# Patient Record
Sex: Female | Born: 1985 | Hispanic: Yes | Marital: Single | Smoking: Never smoker
Health system: Southern US, Community
[De-identification: ages and names within clinical notes are randomized; demographics above are authoritative.]

## PROBLEM LIST (undated history)

## (undated) DIAGNOSIS — E119 Type 2 diabetes mellitus without complications: Secondary | ICD-10-CM

## (undated) DIAGNOSIS — E079 Disorder of thyroid, unspecified: Secondary | ICD-10-CM

## (undated) HISTORY — PX: CHOLECYSTECTOMY: SHX55

---

## 2011-10-23 ENCOUNTER — Observation Stay: Payer: Self-pay | Admitting: Surgery

## 2011-10-23 LAB — COMPREHENSIVE METABOLIC PANEL
Anion Gap: 14 (ref 7–16)
BUN: 7 mg/dL (ref 7–18)
Bilirubin,Total: 0.5 mg/dL (ref 0.2–1.0)
Calcium, Total: 9.4 mg/dL (ref 8.5–10.1)
Chloride: 100 mmol/L (ref 98–107)
Co2: 27 mmol/L (ref 21–32)
Creatinine: 0.62 mg/dL (ref 0.60–1.30)
EGFR (African American): >60
EGFR (Non-African Amer.): >60
Osmolality: 281 (ref 275–301)
Potassium: 3.6 mmol/L (ref 3.5–5.1)
SGOT(AST): 27 U/L (ref 15–37)
SGPT (ALT): 25 U/L
Total Protein: 9.2 g/dL — ABNORMAL HIGH (ref 6.4–8.2)

## 2011-10-23 LAB — CBC
MCH: 28.6 pg (ref 26.0–34.0)
MCHC: 33.8 g/dL (ref 32.0–36.0)
Platelet: 398 10*3/uL (ref 150–440)
RBC: 5.32 10*6/uL — ABNORMAL HIGH (ref 3.80–5.20)
WBC: 8.5 10*3/uL (ref 3.6–11.0)

## 2011-10-23 LAB — URINALYSIS, COMPLETE
Bacteria: NONE SEEN
Bilirubin,UR: NEGATIVE
Ketone: NEGATIVE
Ph: 8 (ref 4.5–8.0)
RBC,UR: 1834 /HPF (ref 0–5)
Specific Gravity: 1.02 (ref 1.003–1.030)
Squamous Epithelial: 8
WBC UR: 109 /HPF (ref 0–5)

## 2011-10-23 LAB — LIPASE, BLOOD: Lipase: 113 U/L (ref 73–393)

## 2011-10-23 LAB — PREGNANCY, URINE: Pregnancy Test, Urine: NEGATIVE m[IU]/mL

## 2011-10-25 LAB — CBC WITH DIFFERENTIAL/PLATELET
Basophil #: 0 10*3/uL (ref 0.0–0.1)
Basophil %: 0.4 %
Eosinophil #: 0.2 10*3/uL (ref 0.0–0.7)
Eosinophil %: 3.9 %
HCT: 38.8 % (ref 35.0–47.0)
HGB: 12.8 g/dL (ref 12.0–16.0)
Lymphocyte #: 2.5 10*3/uL (ref 1.0–3.6)
Lymphocyte %: 39.3 %
MCH: 28.3 pg (ref 26.0–34.0)
MCHC: 33 g/dL (ref 32.0–36.0)
MCV: 86 fL (ref 80–100)
Monocyte #: 0.5 10*3/uL (ref 0.0–0.7)
Monocyte %: 7.7 %
Neutrophil #: 3.1 10*3/uL (ref 1.4–6.5)
Neutrophil %: 78.7 %
Platelet: 274 10*3/uL (ref 150–440)
RBC: 4.52 10*6/uL (ref 3.80–5.20)
RDW: 13.8 % (ref 11.5–14.5)
WBC: 6.4 10*3/uL (ref 3.6–11.0)

## 2011-10-25 LAB — COMPREHENSIVE METABOLIC PANEL
Alkaline Phosphatase: 143 U/L — ABNORMAL HIGH (ref 50–136)
Anion Gap: 10 (ref 7–16)
Bilirubin,Total: 0.5 mg/dL (ref 0.2–1.0)
Calcium, Total: 8 mg/dL — ABNORMAL LOW (ref 8.5–10.1)
Chloride: 102 mmol/L (ref 98–107)
Co2: 27 mmol/L (ref 21–32)
EGFR (African American): >60
EGFR (Non-African Amer.): >60
Osmolality: 274 (ref 275–301)
Potassium: 4.2 mmol/L (ref 3.5–5.1)
SGPT (ALT): 152 U/L — ABNORMAL HIGH
Sodium: 139 mmol/L (ref 136–145)
Total Protein: 6.9 g/dL (ref 6.4–8.2)

## 2011-10-26 LAB — PATHOLOGY REPORT

## 2011-10-30 ENCOUNTER — Inpatient Hospital Stay: Payer: Self-pay | Admitting: Surgery

## 2011-10-30 LAB — PREGNANCY, URINE: Pregnancy Test, Urine: NEGATIVE m[IU]/mL

## 2011-10-30 LAB — URINALYSIS, COMPLETE
Ketone: NEGATIVE
Nitrite: NEGATIVE
Ph: 6 (ref 4.5–8.0)
Protein: NEGATIVE
RBC,UR: <1 /HPF (ref 0–5)
WBC UR: 3 /HPF (ref 0–5)

## 2011-10-30 LAB — COMPREHENSIVE METABOLIC PANEL
Albumin: 3.7 g/dL (ref 3.4–5.0)
Alkaline Phosphatase: 116 U/L (ref 50–136)
BUN: 9 mg/dL (ref 7–18)
Calcium, Total: 8.7 mg/dL (ref 8.5–10.1)
Glucose: 114 mg/dL — ABNORMAL HIGH (ref 65–99)
Osmolality: 275 (ref 275–301)
SGOT(AST): 42 U/L — ABNORMAL HIGH (ref 15–37)
Sodium: 138 mmol/L (ref 136–145)

## 2011-10-30 LAB — CBC
MCH: 28.7 pg (ref 26.0–34.0)
MCHC: 33.8 g/dL (ref 32.0–36.0)
MCV: 85 fL (ref 80–100)
Platelet: 345 10*3/uL (ref 150–440)
RBC: 5.16 10*6/uL (ref 3.80–5.20)
RDW: 14 % (ref 11.5–14.5)

## 2011-10-30 LAB — HCG, QUANTITATIVE, PREGNANCY: Beta Hcg, Quant.: <1 m[IU]/mL — ABNORMAL LOW

## 2011-10-31 LAB — CBC WITH DIFFERENTIAL/PLATELET
Basophil #: 0 10*3/uL (ref 0.0–0.1)
Basophil %: 0 %
Eosinophil #: 0.1 10*3/uL (ref 0.0–0.7)
HCT: 38.1 % (ref 35.0–47.0)
HGB: 12.6 g/dL (ref 12.0–16.0)
MCH: 28.3 pg (ref 26.0–34.0)
MCHC: 33.1 g/dL (ref 32.0–36.0)
MCV: 85 fL (ref 80–100)
Monocyte #: 0.7 10*3/uL (ref 0.0–0.7)
Neutrophil #: 7.7 10*3/uL — ABNORMAL HIGH (ref 1.4–6.5)
Neutrophil %: 77.4 %
RDW: 13.9 % (ref 11.5–14.5)

## 2011-10-31 LAB — COMPREHENSIVE METABOLIC PANEL
Alkaline Phosphatase: 107 U/L (ref 50–136)
BUN: 6 mg/dL — ABNORMAL LOW (ref 7–18)
Calcium, Total: 8.3 mg/dL — ABNORMAL LOW (ref 8.5–10.1)
Chloride: 102 mmol/L (ref 98–107)
Creatinine: 0.62 mg/dL (ref 0.60–1.30)
EGFR (Non-African Amer.): >60
Glucose: 115 mg/dL — ABNORMAL HIGH (ref 65–99)
Potassium: 3.8 mmol/L (ref 3.5–5.1)
SGPT (ALT): 64 U/L
Sodium: 136 mmol/L (ref 136–145)

## 2011-11-01 LAB — CBC WITH DIFFERENTIAL/PLATELET
Basophil #: 0 10*3/uL (ref 0.0–0.1)
Basophil %: 0.3 %
Eosinophil %: 1.5 %
HCT: 33.2 % — ABNORMAL LOW (ref 35.0–47.0)
HGB: 11.2 g/dL — ABNORMAL LOW (ref 12.0–16.0)
Lymphocyte %: 27.2 %
MCH: 28.9 pg (ref 26.0–34.0)
MCHC: 33.8 g/dL (ref 32.0–36.0)
MCV: 86 fL (ref 80–100)
Monocyte #: 1 10*3/uL — ABNORMAL HIGH (ref 0.0–0.7)
Monocyte %: 11.5 %
Platelet: 259 10*3/uL (ref 150–440)
RBC: 3.88 10*6/uL (ref 3.80–5.20)
RDW: 13.9 % (ref 11.5–14.5)
WBC: 8.9 10*3/uL (ref 3.6–11.0)

## 2011-11-01 LAB — COMPREHENSIVE METABOLIC PANEL
Albumin: 2.5 g/dL — ABNORMAL LOW (ref 3.4–5.0)
Alkaline Phosphatase: 99 U/L (ref 50–136)
Bilirubin,Total: 0.4 mg/dL (ref 0.2–1.0)
Calcium, Total: 8.1 mg/dL — ABNORMAL LOW (ref 8.5–10.1)
Chloride: 103 mmol/L (ref 98–107)
Co2: 27 mmol/L (ref 21–32)
Creatinine: 0.6 mg/dL (ref 0.60–1.30)
EGFR (African American): >60
EGFR (Non-African Amer.): >60
Osmolality: 277 (ref 275–301)
SGOT(AST): 41 U/L — ABNORMAL HIGH (ref 15–37)
SGPT (ALT): 61 U/L
Sodium: 140 mmol/L (ref 136–145)

## 2011-11-02 LAB — COMPREHENSIVE METABOLIC PANEL
Albumin: 2.4 g/dL — ABNORMAL LOW (ref 3.4–5.0)
Anion Gap: 12 (ref 7–16)
BUN: 2 mg/dL — ABNORMAL LOW (ref 7–18)
Bilirubin,Total: 0.3 mg/dL (ref 0.2–1.0)
Creatinine: 0.51 mg/dL — ABNORMAL LOW (ref 0.60–1.30)
Glucose: 109 mg/dL — ABNORMAL HIGH (ref 65–99)
Osmolality: 282 (ref 275–301)
SGOT(AST): 34 U/L (ref 15–37)
SGPT (ALT): 60 U/L

## 2011-11-02 LAB — CBC WITH DIFFERENTIAL/PLATELET
Eosinophil #: 0.2 10*3/uL (ref 0.0–0.7)
Eosinophil %: 2.5 %
HCT: 32.1 % — ABNORMAL LOW (ref 35.0–47.0)
Lymphocyte #: 2.8 10*3/uL (ref 1.0–3.6)
Lymphocyte %: 38.1 %
MCHC: 33.3 g/dL (ref 32.0–36.0)
MCV: 85 fL (ref 80–100)
Monocyte %: 8.9 %
Neutrophil #: 3.7 10*3/uL (ref 1.4–6.5)
Platelet: 272 10*3/uL (ref 150–440)
RBC: 3.76 10*6/uL — ABNORMAL LOW (ref 3.80–5.20)
WBC: 7.4 10*3/uL (ref 3.6–11.0)

## 2011-11-03 LAB — COMPREHENSIVE METABOLIC PANEL
Alkaline Phosphatase: 140 U/L — ABNORMAL HIGH (ref 50–136)
Calcium, Total: 8.2 mg/dL — ABNORMAL LOW (ref 8.5–10.1)
Co2: 28 mmol/L (ref 21–32)
EGFR (African American): >60
EGFR (Non-African Amer.): >60
Glucose: 113 mg/dL — ABNORMAL HIGH (ref 65–99)
Osmolality: 282 (ref 275–301)
SGOT(AST): 41 U/L — ABNORMAL HIGH (ref 15–37)
SGPT (ALT): 70 U/L

## 2011-11-03 LAB — CLOSTRIDIUM DIFFICILE BY PCR

## 2011-11-03 LAB — LIPASE, BLOOD: Lipase: 329 U/L (ref 73–393)

## 2011-11-03 LAB — OCCULT BLOOD X 1 CARD TO LAB, STOOL: Occult Blood, Feces: NEGATIVE

## 2011-11-04 LAB — WBCS, STOOL

## 2012-02-03 ENCOUNTER — Emergency Department: Payer: Self-pay | Admitting: Emergency Medicine

## 2012-02-03 LAB — URINALYSIS, COMPLETE
Bacteria: NONE SEEN
Bilirubin,UR: NEGATIVE
Glucose,UR: NEGATIVE mg/dL (ref 0–75)
Hyaline Cast: 1
Ketone: NEGATIVE
Protein: NEGATIVE
Specific Gravity: 1.017 (ref 1.003–1.030)
WBC UR: <1 /HPF (ref 0–5)

## 2012-02-03 LAB — CBC
HGB: 12.9 g/dL (ref 12.0–16.0)
MCH: 27.6 pg (ref 26.0–34.0)
MCHC: 33.1 g/dL (ref 32.0–36.0)
MCV: 83 fL (ref 80–100)
Platelet: 367 10*3/uL (ref 150–440)
RBC: 4.69 10*6/uL (ref 3.80–5.20)
WBC: 8.8 10*3/uL (ref 3.6–11.0)

## 2012-02-03 LAB — COMPREHENSIVE METABOLIC PANEL
Albumin: 3.3 g/dL — ABNORMAL LOW (ref 3.4–5.0)
Alkaline Phosphatase: 166 U/L — ABNORMAL HIGH (ref 50–136)
Anion Gap: 8 (ref 7–16)
BUN: 17 mg/dL (ref 7–18)
Bilirubin,Total: 0.3 mg/dL (ref 0.2–1.0)
Calcium, Total: 8.5 mg/dL (ref 8.5–10.1)
Chloride: 105 mmol/L (ref 98–107)
Co2: 27 mmol/L (ref 21–32)
EGFR (African American): >60
Glucose: 105 mg/dL — ABNORMAL HIGH (ref 65–99)
Osmolality: 281 (ref 275–301)
SGOT(AST): 68 U/L — ABNORMAL HIGH (ref 15–37)
SGPT (ALT): 40 U/L
Total Protein: 7.8 g/dL (ref 6.4–8.2)

## 2012-02-03 LAB — PREGNANCY, URINE: Pregnancy Test, Urine: NEGATIVE m[IU]/mL

## 2013-01-12 IMAGING — US ABDOMEN ULTRASOUND
1 series · 17 of 25 positions shown · non-contrast
Comparison: none

REASON FOR EXAM: postop LFT elevation
COMMENTS:

PROCEDURE:     US  - US ABDOMEN GENERAL SURVEY  - October 31, 2011  [DATE]
RESULT:

[Series 1: abdomen ultrasound · 17 of 93 slices shown]
[im 1/93]
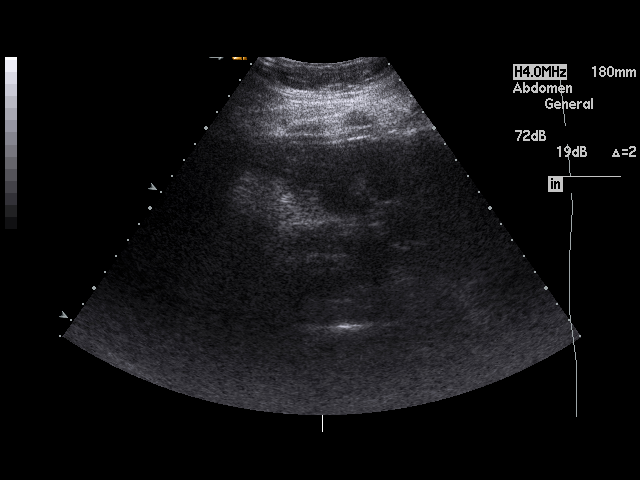
[im 8/93]
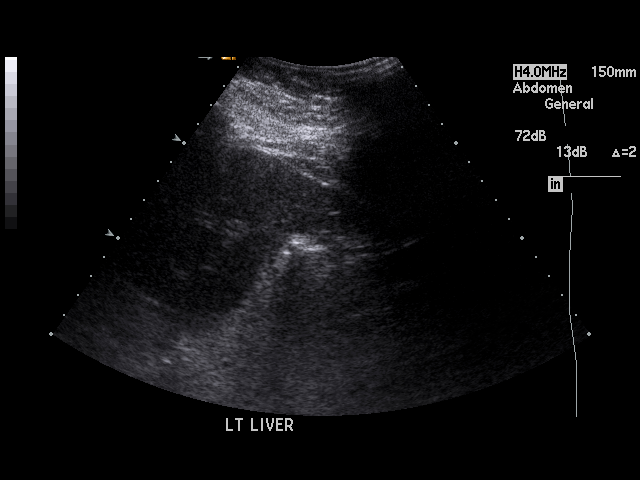
[im 12/93]
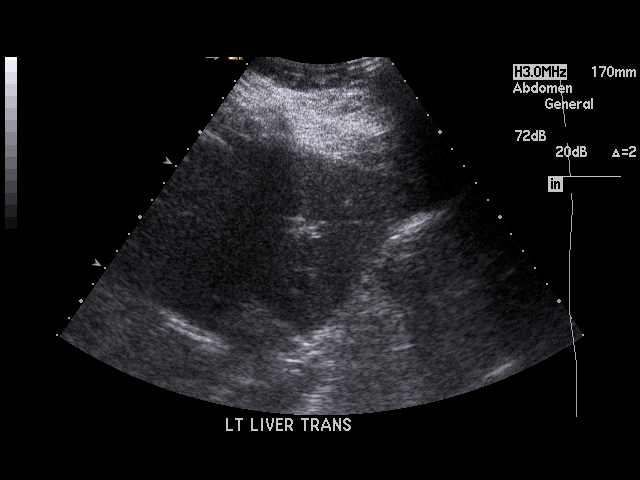
[im 20/93]
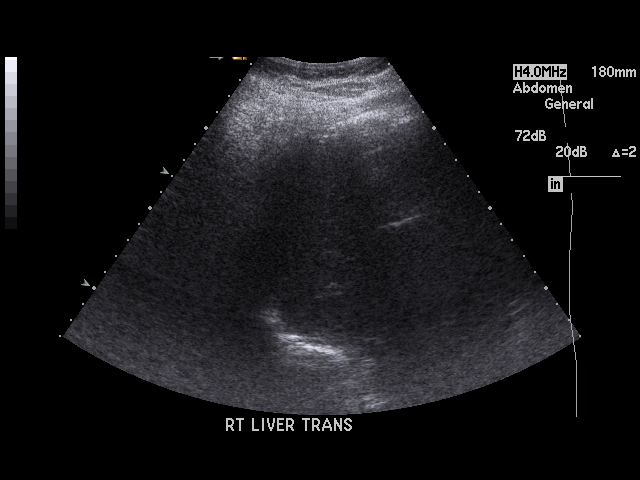
[im 24/93]
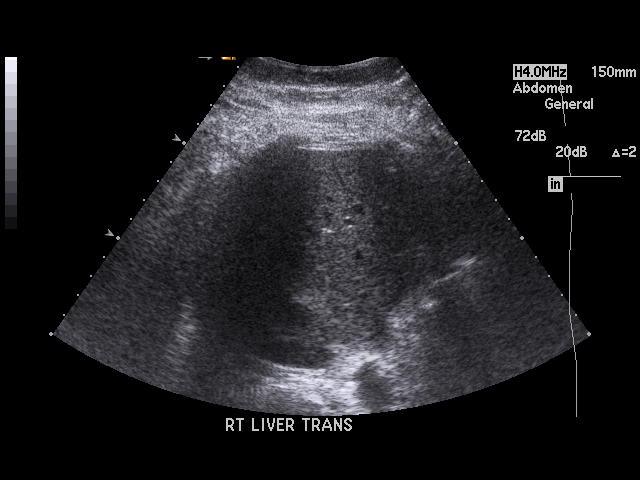
[im 31/93]
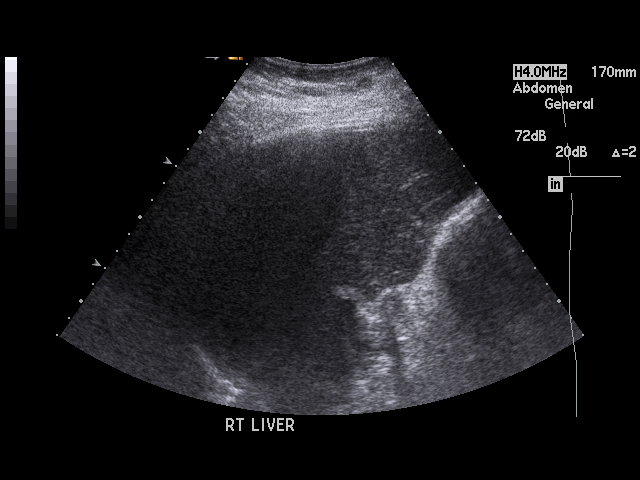
[im 35/93]
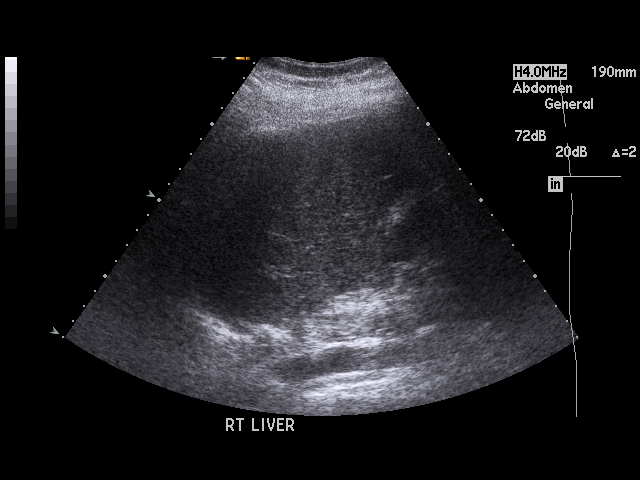
[im 43/93]
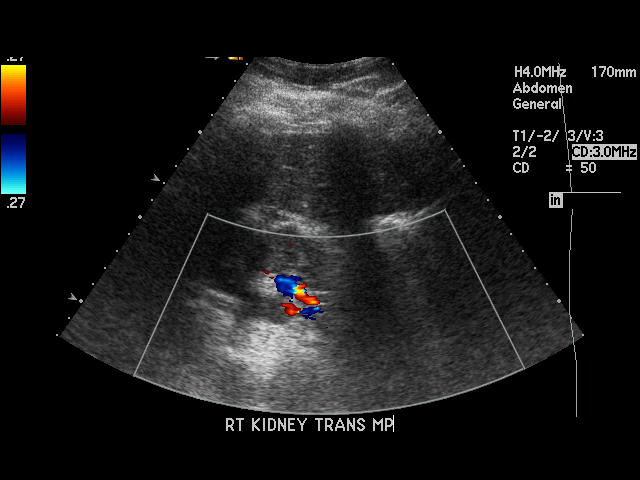
[im 47/93]
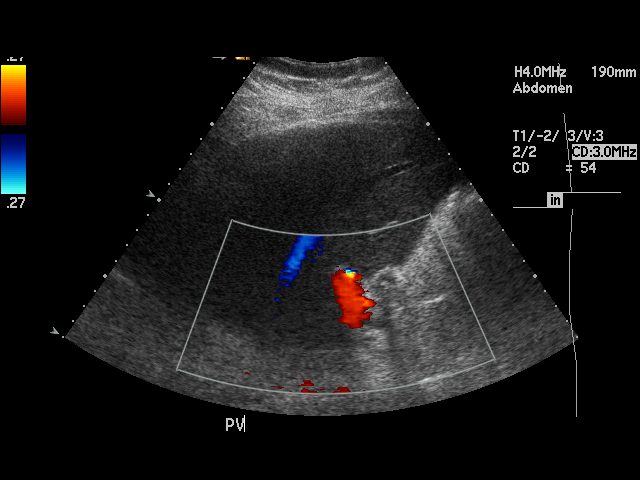
[im 50/93]
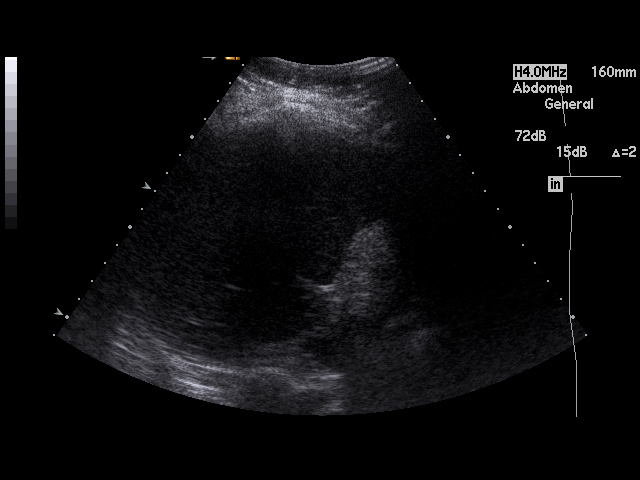
[im 58/93]
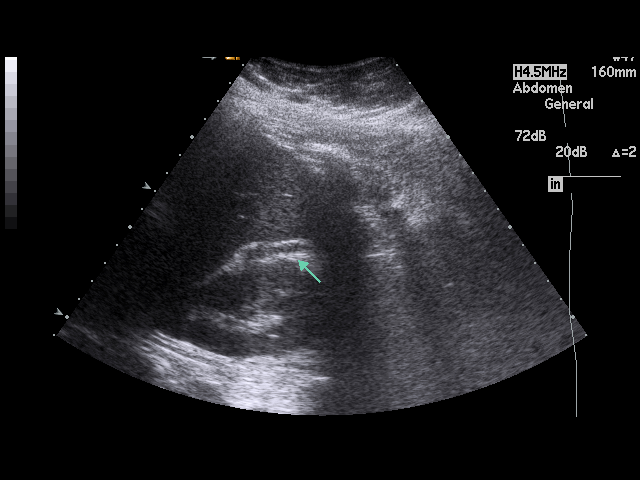
[im 62/93]
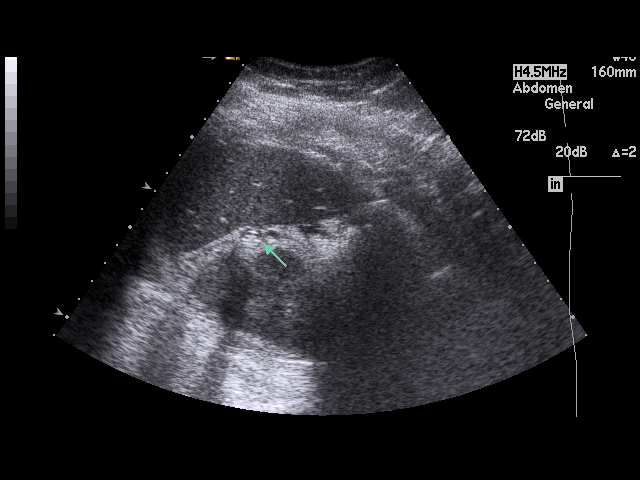
[im 70/93]
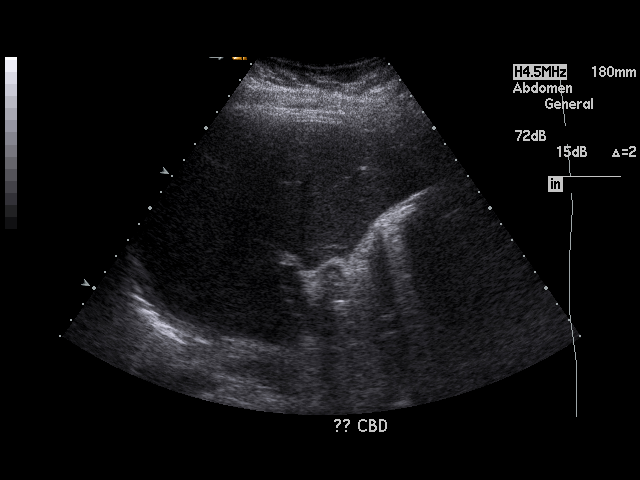
[im 73/93]
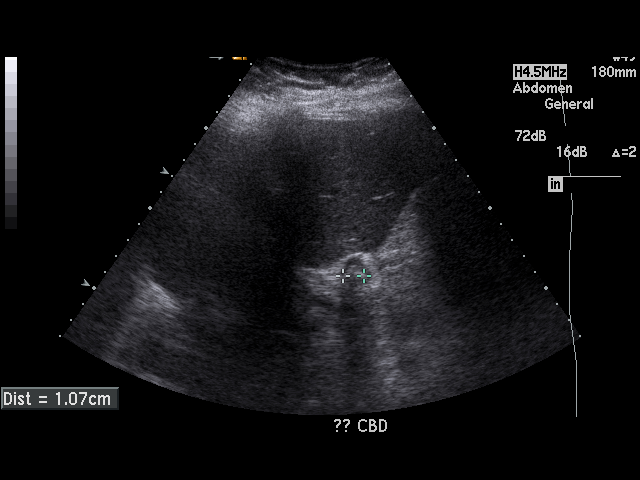
[im 81/93]
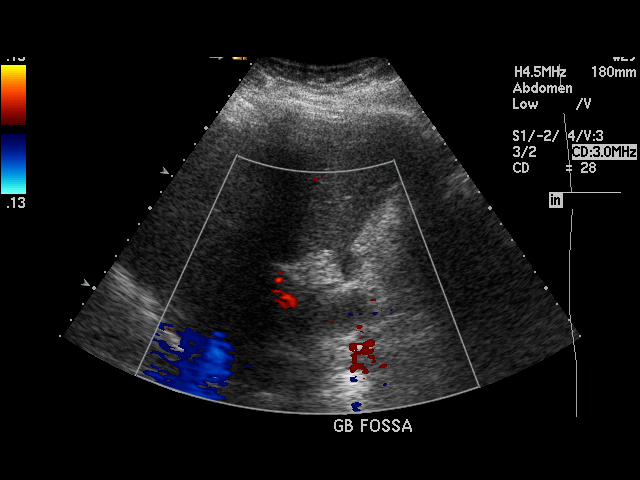
[im 85/93]
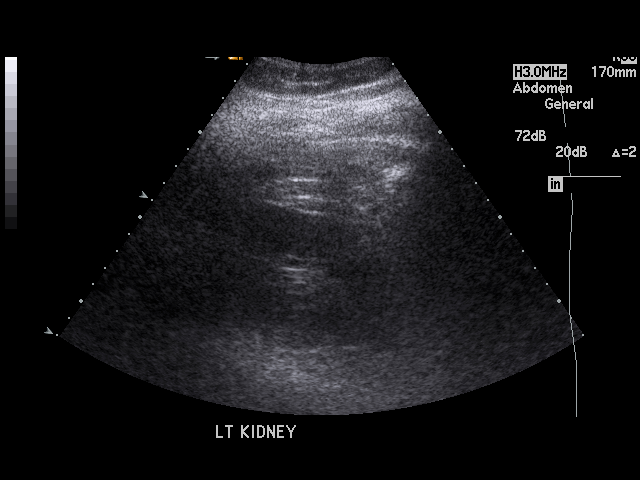
[im 93/93]
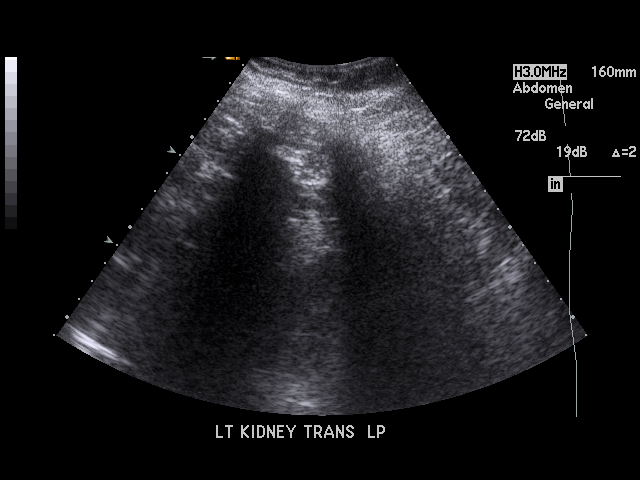

[17 of 25 positions shown; findings below may reference images not displayed]

FINDINGS: The liver demonstrates a homogeneous echotexture.  Hepatopetal
flow is identified within the portal vein.

The aorta and IVC are unremarkable. The pancreas is not visualized.

The gallbladder fossa demonstrates findings consistent with prior
cholecystectomy. The common bile duct measures 15.9 mm in diameter. An
echogenic focus projects within the common bile duct measuring 1.07 cm in
diameter. This finding is concerning for a retained common bile duct stone.

A serpiginous echogenic focus projects in the region of Morison's pouch.
This area has the appearance of the patient's known biliary drainage
catheter. The catheter tip is not appreciated beyond Morison's pouch. The
spleen is homogeneous in echotexture and measures 9.02 cm in longitudinal
dimensions.

The right kidney measures 10.10 x 5.44 x 4.17 cm and the left 11.62 x 5.5 x
4.17 cm. There is no evidence of hydronephrosis, masses, calculi, or solid
or cystic masses.  The left kidney is difficult to visualize.
IMPRESSION: 1. Finding suspicious for retained stone in the common bile duct as
described above. Further evaluation with M.R.C.P. may help to further
characterize this finding or ERCP if clinically warranted.
2. Drainage catheter with the tip not appreciated beyond Morison's pouch.
There does not appear to be significant fluid within the gallbladder fossa.
A trace amount of fluid is identified.

## 2013-06-01 ENCOUNTER — Emergency Department: Payer: Self-pay | Admitting: Internal Medicine

## 2013-06-01 LAB — URINALYSIS, COMPLETE
Bilirubin,UR: NEGATIVE
Blood: NEGATIVE
Glucose,UR: NEGATIVE mg/dL (ref 0–75)
Ph: 5 (ref 4.5–8.0)
Protein: NEGATIVE
Squamous Epithelial: 4

## 2013-06-01 LAB — PREGNANCY, URINE: Pregnancy Test, Urine: NEGATIVE m[IU]/mL

## 2014-02-11 ENCOUNTER — Emergency Department: Payer: Self-pay | Admitting: Emergency Medicine

## 2014-12-07 NOTE — Consult Note (Signed)
Chief Complaint:   Subjective/Chief Complaint Please see full GI consult.  Patient admitted with recurretn abdominal pain after [facility].  Abd us showing cbd on 3/10 at 5.395mm, now at 15.807mm.  Likely cbd stone. Bili is not elevated, however there is a JP drain in place in Morrisons pouch.  Agree with ERCP to remove cbd stone and possibly stent the cbd if there is any evidence on cholangiogram of bile duct leak.  Currently stable and afebrile, I will d/w Dr Bluford Kaufmannh tomorrow for consideration of ercp.   VITAL SIGNS/ANCILLARY NOTES: **Vital Signs.:   19-Mar-13 14:30   Vital Signs Type Q 4hr   Temperature Temperature (F) 98.3   Celsius 36.8   Temperature Source oral   Pulse Pulse 95   Pulse source per Dinamap   Respirations Respirations 20   Systolic BP Systolic BP 106   Diastolic BP (mmHg) Diastolic BP (mmHg) 70   Mean BP 82   BP Source Dinamap   Pulse Ox % Pulse Ox % 92   Pulse Ox Activity Level  At rest   Oxygen Delivery Room Air/ 21 %    17:31   Vital Signs Type Q 4hr   Temperature Temperature (F) 99   Celsius 37.2   Temperature Source oral   Pulse Pulse 85   Pulse source per Dinamap   Respirations Respirations 20   Systolic BP Systolic BP 102   Diastolic BP (mmHg) Diastolic BP (mmHg) 64   Mean BP 76   BP Source Dinamap   Pulse Ox % Pulse Ox % 95   Pulse Ox Activity Level  At rest   Oxygen Delivery Room Air/ 21 %   Electronic Signatures: Barnetta ChapelSkulskie, Martin (MD)  (Signed 19-Mar-13 23:28)  Authored: Chief Complaint, VITAL SIGNS/ANCILLARY NOTES   Last Updated: 19-Mar-13 23:28 by Barnetta ChapelSkulskie, Martin (MD)

## 2014-12-07 NOTE — Consult Note (Signed)
PATIENT NAME:  Jane Crawford, Jane Crawford MR#:  [id number] DATE OF BIRTH:  [birth year]  DATE OF CONSULTATION:  11/01/2011  REFERRING PHYSICIAN:  Not dictated CONSULTING PHYSICIAN:  Keturah Barrehristiane H. London, NP  HISTORY OF PRESENT ILLNESS: Ms. Jane Crawford is a 29 year old Hispanic woman who was admitted on postoperative day five post laparoscopic cholecystectomy. She is presenting with right upper quadrant pain postoperatively. She states this right upper quadrant tenderness has been present for three days and that she has had fever for one day on Monday. She denies redness, pus, or inflammation at the surgery site and continues to have a JP drain there. She has no other gastrointestinal complaints. On ultrasound there is a concern of common bile duct stone with ductal dilation. She has been on Unasyn and her fever has currently resolved. Her vital signs are stable.   PAST MEDICAL HISTORY: None.   PAST SURGICAL HISTORY: Recent cholecystectomy.   ALLERGIES: None.   MEDICATIONS: Vicodin p.r.n.   FAMILY HISTORY: Noncontributory.   SOCIAL HISTORY: Married, lives at home. Does not smoke.   REVIEW OF SYSTEMS: Ten systems reviewed. Negative other than what is mentioned above in the History of Present Illness.   LABS/STUDIES: Most recent lab work: Glucose 109, BUN 3, creatinine 0.6, serum sodium 140, potassium 3.7, chloride 103, CO2 27, GFR greater than 60, calcium 8.1, total protein 6.7, serum albumin 2.5, total bilirubin 0.4, alkaline phosphatase 99, AST 41, ALT 61, WBC 8.9, hemoglobin 11.2, hematocrit 33.2, platelet count 259, normocytosis. Beta hCG as well as urine pregnancy test are both negative. UA is without signs of urinary tract infection. Chest x-ray with questionable atelectasis at the left lung base. Abdominal ultrasound 03/18 with a common bile duct measuring 15.9 mm, an echogenic focus projecting in the common bile duct measuring 1.07 cm in diameter concerning for a retained common bile duct  stone. She had a HIDA scan that was unremarkable. No abnormal radiotracer paths are appreciated to suggest fistula formation.   PHYSICAL EXAMINATION:  VITAL SIGNS: Most recent vital signs: Temperature 98.3, pulse 95, respiratory rate 20, blood pressure 106/70, oxygen saturation 92%.   GENERAL: Well-appearing Hispanic female, no acute distress.   HEENT: Normocephalic, atraumatic. Sclerae anicteric. No redness, drainage, or inflammation to the eyes or nares. Oral mucous membranes are pink and moist.   NECK: No thyromegaly, JVD, or lymphadenopathy.   RESPIRATORY: Respirations eupneic. Lungs clear.   CARDIAC: S1, S2. No MRG. Peripheral pulses 2+ bilaterally. No edema. Regular rate and rhythm.   ABDOMEN: Rounded abdomen. Bowel sounds times four. Soft. Tenderness to the right side. No peritoneal signs. JP drain intact to RUQ with dressing CDI. Drainage is scant, clear yellowish.  GENITOURINARY: Deferred.   RECTAL: Deferred.   EXTREMITIES: Without clubbing, cyanosis, or edema. MAEW times four. Sensation intact.   SKIN: Warm, dry, pink, without erythema, lesion, or rash.   PSYCH: Pleasant, cooperative. Mood stable. Logical thought.  NEURO: Alert and oriented times three. Cranial nerves are grossly intact. No facial droop. Speech clear.   IMPRESSION AND PLAN: Choledocholithiasis. Recommend ERCP. If required more urgently please call Dr. Niel HummerIftikhar as Dr. Bluford Kaufmannh is out of town today.   Thank you for this consult. These services were provided by Vevelyn Pathristiane London, MSN, NPC in collaboration with Barnetta ChapelMartin Skulskie, MD.     ____________________________ Keturah Barrehristiane H. London, NP chl:bjt D: 11/01/2011 17:34:29 ET T: 11/01/2011 18:04:38 ET JOB#: [id number]  cc: Keturah Barrehristiane H. London, NP, <Dictator> Eustaquio MaizeHRISTIANE H LONDON FNP ELECTRONICALLY SIGNED 11/02/2011 8:35

## 2014-12-07 NOTE — Discharge Summary (Signed)
PATIENT NAME:  Jane Crawford, Jane Crawford MR#:  [id number] DATE OF BIRTH:  November 06, 1985  DATE OF ADMISSION:  10/23/2011 DATE OF DISCHARGE:  10/25/2011  FINAL DIAGNOSIS: Acute calculus cholecystitis.   PRINCIPLE PROCEDURE: Laparoscopic cholecystectomy.   HOSPITAL COURSE SUMMARY: The patient was admitted on March 10th with abdominal pain and diagnosed with acute calculus cholecystitis. The following day the patient went to the operating room were cholecystectomy was performed. Hydrops was encountered. Postoperatively she did well. Pain was well controlled. Jackson-Pratt drain was left in place with her to follow-up in the office in one week's time. She tolerated a regular diet and had adequate pain control.   DISCHARGE MEDICATIONS: Norco 5/325 1 to 2 tabs p.o. q.4 hours p.r.n. pain.  FOLLOW-UP: Follow-up with me in the office on March 19th at 2:30 p.m. [place] location. The patient can call the office with any questions or concerns.   ____________________________ Redge GainerMark A. Egbert GaribaldiBird, MD mab:drc D: 11/08/2011 07:45:00 ET T: 11/08/2011 15:06:40 ET JOB#: [id number]  cc: Loraine LericheMark A. Egbert GaribaldiBird, MD, <Dictator> Raynald KempMARK A Montavious Wierzba MD ELECTRONICALLY SIGNED 11/08/2011 23:42

## 2014-12-07 NOTE — Discharge Summary (Signed)
PATIENT NAME:  Jane Crawford, ELIDA YOJANA MR#:  [id number] DATE OF BIRTH:  1986/01/22  DATE OF ADMISSION:  10/30/2011 DATE OF DISCHARGE:  11/05/2011  DISCHARGE DIAGNOSES: Retained common bile duct stone, jaundice, and abdominal pain.   CONSULTANT: GI.   PROCEDURES: ERCP with stone extraction.   HISTORY OF PRESENT ILLNESS/HOSPITAL COURSE: The patient was five days status post laparoscopic cholecystectomy performed by Dr. Egbert GaribaldiBird, presented after doing quite well postoperatively with the acute onset of abdominal pain and a work-up that showed dilated common bile duct and elevated liver function tests suggestive of a retained common bile duct stone.   Gastroenterology was consulted, and an ERCP was performed with removal of sludge from the bile duct. Postoperatively, her pain was much improved and she was discharged in stable condition to follow up in our office in 10 days. She was given Vicodin for pain.    ____________________________ Adah Salvageichard E. Excell Seltzerooper, MD rec:cbb D: 11/12/2011 12:24:39 ET T: 11/14/2011 11:11:35 ET JOB#: [id number]  cc: Adah Salvageichard E. Excell Seltzerooper, MD, <Dictator> Lattie HawICHARD E Daniela Siebers MD ELECTRONICALLY SIGNED 11/15/2011 9:55

## 2014-12-07 NOTE — H&P (Signed)
Subjective/Chief Complaint rt side pain    History of Present Illness acute onset rt side pain. postop day 5 lap chole. no grams. was doing well until this am. no n/v no f/c pain around drain site    Past History recent lap chole. no pmh/psh otherwise   Past Med/Surgical Hx:  Denies medical history:   ALLERGIES:  No Known Allergies:   Family and Social History:   Family History Non-Contributory    Social History negative tobacco    Place of Living Home   Review of Systems:   Fever/Chills No    Cough No    Abdominal Pain Yes    Diarrhea No    Constipation No    Nausea/Vomiting No    SOB/DOE No    Tolerating Diet Yes   Physical Exam:   GEN NAD, obese    HEENT pink conjunctivae    NECK supple    RESP normal resp effort  clear BS    CARD regular rate    ABD positive tenderness  soft  no bile in drain, min purulence at drain site    EXTR negative edema    SKIN normal to palpation, no jaundice    PSYCH alert, A+O to time, place, person, good insight   Routine UA:  17-Mar-13 15:53    Color (UA) Straw   Clarity (UA) Clear   Glucose (UA) Negative   Bilirubin (UA) Negative   Ketones (UA) Negative   Specific Gravity (UA) 1.010   Blood (UA) Negative   pH (UA) 6.0   Protein (UA) Negative   Nitrite (UA) Negative   Leukocyte Esterase (UA) Trace   RBC (UA) <1 /HPF   WBC (UA) 3 /HPF   Epithelial Cells (UA) 1 /HPF   Mucous (UA) PRESENT  Routine Sero:  17-Mar-13 15:53    Pregnancy Test, Urine NEGATIVE  Routine Hem:  17-Mar-13 21:13    WBC (CBC) 13.3   RBC (CBC) 5.16   Hemoglobin (CBC) 14.8   Hematocrit (CBC) 44.0   Platelet Count (CBC) 345   MCV 85   MCH 28.7   MCHC 33.8   RDW 14.0  Routine Chem:  17-Mar-13 21:13    Glucose, Serum 114   BUN 9   Creatinine (comp) 0.73   Sodium, Serum 138   Potassium, Serum 3.7   Chloride, Serum 101   CO2, Serum 24   Calcium (Total), Serum 8.7  Hepatic:  17-Mar-13 21:13    Bilirubin, Total 0.4    Alkaline Phosphatase 116   SGPT (ALT) 80   SGOT (AST) 42   Total Protein, Serum 8.9   Albumin, Serum 3.7  Routine Chem:  17-Mar-13 21:13    Osmolality (calc) 275   eGFR (African American) >60   eGFR (Non-African American) >60   Anion Gap 13   Lipase 111   HCG Betasubunit Quant. Serum < 1     Assessment/Admission Diagnosis postop day 5 with ruq pain. no bile in drain, doubt leak. suspect retained stone. admit, pain control repeat labs. U/S and HIDA in am discussed via interpretor   Electronic Signatures: Florene Glen (MD)  (Signed 17-Mar-13 23:04)  Authored: CHIEF COMPLAINT and HISTORY, PAST MEDICAL/SURGIAL HISTORY, ALLERGIES, FAMILY AND SOCIAL HISTORY, REVIEW OF SYSTEMS, PHYSICAL EXAM, LABS, ASSESSMENT AND PLAN   Last Updated: 17-Mar-13 23:04 by Florene Glen (MD)

## 2014-12-07 NOTE — H&P (Signed)
PATIENT NAME:  Jane Crawford, ELIDA YOJANA MR#:  [id number] DATE OF BIRTH:  02-Jan-1986  DATE OF ADMISSION:  10/30/2011  CHIEF COMPLAINT: Right side pain.   HISTORY OF PRESENT ILLNESS: This patient is postoperative day 5. Dr. Egbert GaribaldiBird performed a laparoscopic cholecystectomy. She had normal liver function tests postoperatively and now they are elevated. I was asked to see the patient for right upper quadrant pain and postoperative.   Via an interpreter, the patient is interviewed. She describes the acute onset of pain this morning. Was doing well postoperatively until this morning. Has had no nausea or vomiting, no fevers or chills, no jaundice or acholic stools. Had a normal bowel movement today. Has not had any vomiting.   PAST MEDICAL HISTORY: None.   PAST SURGICAL HISTORY: Recent cholecystectomy. No cholangiography was performed. Operative report has been reviewed.   ALLERGIES: None.   MEDICATIONS: Vicodin.   FAMILY HISTORY: Noncontributory.   SOCIAL HISTORY: She does not smoke and lives at home.   REVIEW OF SYSTEMS: A 10-system review is performed and negative with the exception of that mentioned in the history of present illness.   PHYSICAL EXAMINATION:  GENERAL: Healthy, uncomfortable-appearing Hispanic female patient. Weight of 224 pounds, 101 kg. A temperature of 99, pulse of 94, respirations 17, blood pressure 100/59. Pain scale of 6. Room air saturations 99%.   HEENT: No scleral icterus. No palpable neck nodes.   CHEST: Clear to auscultation.   CARDIAC: Regular rate and rhythm.   ABDOMEN: Abdomen shows multiple incisions covered with Steri-Strips without erythema. There is no erythema around the drain insertion site. There is a JP in the lateral incision with a very minimal amount of purulence on the dressing. She is tender in this area with no rebound or percussion tenderness and no guarding.   EXTREMITIES: Without edema. Calves are nontender.   NEUROLOGIC: Grossly intact.    INTEGUMENT: No jaundice.   LABORATORY STUDIES: Laboratory values demonstrate a lipase of 111, AST and ALT of 42 and 80 with a normal bilirubin. (These are compared to immediate postoperative labs, in which the AST and ALT were 104 and 152 with a normal bilirubin. Preoperatively, the AST and ALT were 27 and 25 respectively.) Currently, the white blood cell count is elevated at 13.3 with hemoglobin and hematocrit of 15 and 44 and platelet count of 345.   ASSESSMENT AND PLAN: This is a patient who, in my opinion, presents with abdominal pain in the postoperative period suggestive of a retained common bile duct stone. Reading Dr. Molinda BailiffBird's operative report, he milked the stone out of the cystic duct and did not obtain a cholangiogram, and this suggests the likelihood or possibility that there is a retained stone that has made its way down into the common bile duct, causing her pain. She also has a very small amount of purulence around her drain site with no bile in her drain to suggest leak. My plan would be to admit the patient to in the hospital, control her pain with IV narcotics, start antibiotics, and obtain an ultrasound in the morning, repeating her labs, with the possibility of obtaining a HIDA scan later in the day. This was discussed with the patient via an interpreter, and she was in agreement with this plan.   ____________________________ Adah Salvageichard E. Excell Seltzerooper, MD rec:al D: 10/30/2011 23:09:17 ET      T: 10/31/2011 09:09:51 ET JOB#: [id number]  cc: Adah Salvageichard E. Excell Seltzerooper, MD, <Dictator> Lattie HawICHARD E Senovia Gauer MD ELECTRONICALLY SIGNED 11/01/2011 6:52

## 2014-12-07 NOTE — H&P (Signed)
PATIENT NAME:  Jane Crawford, Jane Crawford MR#:  [id number] DATE OF BIRTH:  02-Apr-1986  DATE OF ADMISSION:  10/23/2011  CHIEF COMPLAINT: Abdominal pain starting at 5:00 yesterday afternoon, nausea and vomiting.   HISTORY: This 29 year old Hispanic female developed acute onset of epigastric and right upper quadrant abdominal pain rather suddenly at 5:00 yesterday afternoon. Prior to this, the patient was feeling well. She had similar episodes one month and two weeks ago. Not of this severity but clearly of the same nature by her report. The history is obtained with the help of a Spanish interpreter. The patient has not had pain like this prior to a month ago. She did not know she had gallstones. There is no family history of gallstones. She denies any fever or jaundice. No sick contacts. She has a 31 year old child. Pain is located in her epigastrium and radiates into her right upper quadrant. It is dull and boring in nature and severe. It started abruptly as described above. The only thing that helps it is morphine. She was resting comfortably after she had morphine on the floor.   ALLERGIES: None.   MEDICATIONS: None.   PAST MEDICAL HISTORY: None.   PAST SURGICAL HISTORY: None.   SOCIAL HISTORY: She is married. One child. Does not smoke. Does not drink.   FAMILY HISTORY: Negative for gallbladder disease, heart disease or hypertension.   REVIEW OF SYSTEMS: Significant for nausea, vomiting, abdominal pain as described but no fevers. No yellow jaundice. No darkening of her urine. She has had abdominal pain like this in the past. No easy bruisability. No rashes. Remaining ten-point review is unremarkable.   PHYSICAL EXAMINATION:  GENERAL: The patient is in no obvious distress.   VITAL SIGNS: Temperature 97.8, pulse 80, respiratory rate 24, blood pressure 166/64, weight 260 pounds, BMI 52, height 58 inches.   HEENT: Sclerae are anicteric. Facies are symmetrical.   NECK: Supple. No adenopathy. No  thyromegaly.   LUNGS: Clear bilaterally with no extrapulmonary sounds.   HEART: Regular rate and rhythm. There is no edema. There is no murmurs.   ABDOMEN: Abdominal examination demonstrates tenderness in the epigastrium and right upper quadrant consistent with a Murphy's sign. There is no hernias. No masses. No scars. No evidence of hepatosplenomegaly on exam.   SKIN: Warm and well perfused.   NEUROLOGIC: Cranial nerves appear to be intact. Otherwise normal.   PSYCHIATRIC: Normal mood, judgment, affect, insight and judgment.   LABORATORY, DIAGNOSTIC, AND RADIOLOGICAL DATA: Urinalysis is significant for 1834 red blood cells per high-power field, 109 white cells per high-power field. White count 8.5, hemoglobin 15.2, hematocrit 45%, platelet count 398,000, albumin 4.3, total bilirubin 0.3, alkaline phosphatase 103, AST 27, ALT 25, lipase 113, sodium 141, potassium 3.6, chloride 100, glucose 137, creatinine 0.3. Ultrasound was obtained of the right upper quadrant which I personally reviewed on the PACS monitor demonstrates normal right kidney, normal left kidney. No signs of hydronephrosis, solid masses or cystic masses or calculi. Common bile duct measures 5.2 mm. Spleen is normal. Gallbladder wall thickness is 5.5 mm. Positive sonographic Murphy's sign. Mobile stones seen within the gallbladder.   IMPRESSION: Acute calculus cholecystitis. Dirty urinalysis I suspect is a contamination.   PLAN: The patient needs to be admitted, hydrated, be started on intravenous antibiotics and hydration. I will obtain a catheter urinalysis. I will also obtain a urine pregnancy test. I discussed with her and her husband through the use of a translator laparoscopic cholecystectomy to be done under general anesthesia tomorrow  as an urgent operation. They understand the risks of surgery including that of bleeding, infection, need for conversion to open operation and 1 in 200 risk of common bile duct injury. All of  their questions were answered and orders will be written.   TOTAL TIME SPENT WITH THE PATIENT: 45 minutes.   ____________________________ Redge Gainer Egbert Garibaldi, MD mab:cms D: 10/23/2011 13:11:27 ET T: 10/23/2011 13:45:04 ET JOB#: [id number]  cc: Loraine Leriche A. Egbert Garibaldi, MD, <Dictator>  Raynald Kemp MD ELECTRONICALLY SIGNED 10/24/2011 17:44

## 2014-12-07 NOTE — Consult Note (Signed)
Brief Consult Note: Diagnosis: RUQ pain.   Patient was seen by consultant.   Consult note dictated.   Discussed with Attending MD.   Comments: Appreciate consult for 29 y/o Hispanic woman with history of cholecystectomy 3/12 for RUQ pain and concern of stone in CBD. Patient reports a 3d history of ruq tenderness, and fever for one day. Denies redness, pus, and inflammation at surgery site.  No other GI complaints. Has been on Unasyn and currently afebrile with stable VS. US did demonstrate a common bile duct of 15.509mm with an echogenic focus  measuring 1.07cm.  Remains with some mild elevation of AST, but bilirubin, ALT, ALP normal. Impression: choledolithiasis. Recommend ERCP- if required more urgently, please call Dr Niel HummerIftikhar, as Dr Bluford Kaufmannh is out of town today. Thank you for this consult.  Electronic Signatures: Vevelyn PatLondon, Jaiyden Laur H (NP)  (Signed 19-Mar-13 16:15)  Authored: Brief Consult Note   Last Updated: 19-Mar-13 16:15 by Keturah BarreLondon, Caral Whan H (NP)

## 2014-12-07 NOTE — Consult Note (Signed)
Very difficult ERCP. Initially unable to cannulate CBD. Guidewire kept going up the PD. Due to signif bend at the genu, unable to get wire deep enough to place PD stent. Finally, needle knife sphincterotomy perfomred, With this able to cannulate CBD. CBD dilated. Sphincterotomy performed with good bile drainage. Some sludge came out but no stones. Keep patient npo except meds rest of today. Watch for pancreatitis. Will follow. Thanks.  Electronic Signatures: Lutricia Feilh, Kathalina Ostermann (MD)  (Signed on 20-Mar-13 16:10)  Authored  Last Updated: 20-Mar-13 16:10 by Lutricia Feilh, Kiaraliz Rafuse (MD)

## 2014-12-07 NOTE — Op Note (Signed)
PATIENT NAME:  Jane Crawford, Jane Crawford MR#:  [id number] DATE OF BIRTH:  [birth year]  DATE OF PROCEDURE:  10/24/2011  PREOPERATIVE DIAGNOSIS: Acute calculus cholecystitis.   POSTOPERATIVE DIAGNOSIS: Acute calculus cholecystitis.   PROCEDURE PERFORMED: Laparoscopic cholecystectomy.   SURGEON: Alira Fretwell A. Egbert GaribaldiBird, MD   ASSISTANT: CST times 2.  ANESTHESIA: General endotracheal.   INDICATIONS: This is a 29 year old Hispanic female who presents with several day history of worsening right upper quadrant and epigastric abdominal pain. Physical examination demonstrated exquisite tenderness in the epigastrium and right upper quadrant. Liver function tests were normal. Gallbladder ultrasound demonstrated stones with no evidence of biliary ductal dilatation. Gallbladder wall was thickened at 5.5 mm. I discussed with her and her family laparoscopic cholecystectomy. They understand the risks of surgery including that of bleeding, infection, need for conversion to open operation, bile injury leak, and need for future ERCP. All of their questions were answered.   FINDINGS: Hydrops, stones, large stone impacted in gallbladder neck.   SPECIMENS: Gallbladder with contents.   ESTIMATED BLOOD LOSS: Minimal.   DRAINS: Jackson-Pratt x1 in the gallbladder fossa.   DESCRIPTION OF PROCEDURE: With informed consent, the patient was brought to the operating room and positioned supine. General endotracheal anesthesia was induced. Her abdomen was widely prepped and draped utilizing ChloraPrep solution. Time-out procedure was observed.   A 12 mm blunt Hassan trocar was placed through an open technique through an infraumbilical transversely oriented skin incision with stay sutures being passed through the fascia. Pneumoperitoneum was established. A 12 mm Bladeless trocar was placed in the epigastrium, two 5 mm first assistant ports in the right subcostal margin. The gallbladder was grasped along its fundus and elevated towards  the right lobe of the liver. Adhesions were taken down to the omentum off the body and fundus of the gallbladder.. Lateral traction was placed on Hartmann's pouch. Triangle of Calot was dissected free. A single cystic artery was identified immediately adjacent to the cystic node of Calot, doubly clipped on the portal side, singly clipped on the gallbladder side. The peritoneum on both medial and lateral aspect along the gallbladder fossa was incised with hook cautery. The cystic duct was thickened in this area. There was evidence of an impacted stone which was able to be milked back into the gallbladder. Due to the thickness of the cystic duct, it was transected with a single fire of the endoscopic 35 mm stapler with bowel load application. The gallbladder was then retrieved off the gallbladder fossa utilizing hook cautery apparatus and placed into an EndoCatch device and retrieved. The abdomen was irrigated with a total of 1 liter of warm saline and aspirated dry and point hemostasis in the gallbladder fossa was achieved with electrocautery. No evidence of bile leak or bowel injury at this point. Ports were then removed under direct vision. The infraumbilical fascial defect was closed with several interrupted figure-of-eight #0 Vicryl sutures and the existing stay sutures tied to each other. A total of 30 mL of 0.25% plain Marcaine was infiltrated along all skin and fascial incisions prior to closure. 4-0 Vicryl subcuticular was used to reapproximate skin edges. At the conclusion of the laparoscopic portion of the operation, a 19 mm Blake drain was directed into the gallbladder fossa and exited the lowermost right-sided port site incision securing the exit site with nylon suture. Sterile dressings were applied. The patient was subsequently extubated and taken to the recovery room in stable and satisfactory condition by anesthesia.   ____________________________ Redge GainerMark A. Egbert GaribaldiBird, MD mab:drc  D: 10/24/2011 17:41:02  ET T: 10/25/2011 11:08:38 ET JOB#: [id number]  cc: Loraine Leriche A. Egbert Garibaldi, MD, <Dictator> Raynald Kemp MD ELECTRONICALLY SIGNED 10/25/2011 17:02

## 2014-12-07 NOTE — Consult Note (Signed)
Pt in bathroom. Pt now with c.diff. Started on flagyl yest. Will check back tomorrow AM.  Electronic Signatures: Lutricia Feilh, Diontay Rosencrans (MD)  (Signed on 22-Mar-13 15:49)  Authored  Last Updated: 22-Mar-13 15:49 by Lutricia Feilh, Adell Panek (MD)

## 2014-12-07 NOTE — Consult Note (Signed)
Chief Complaint:   Subjective/Chief Complaint No more abd pain. No evidence of pancreatitis. Diarrhea today.   VITAL SIGNS/ANCILLARY NOTES: **Vital Signs.:   21-Mar-13 09:38   Vital Signs Type Q 4hr   Temperature Temperature (F) 97.7   Celsius 36.5   Temperature Source oral   Pulse Pulse 88   Pulse source per Dinamap   Respirations Respirations 20   Systolic BP Systolic BP 695   Diastolic BP (mmHg) Diastolic BP (mmHg) 78   Mean BP 94   BP Source Dinamap   Pulse Ox % Pulse Ox % 94   Pulse Ox Activity Level  At rest   Oxygen Delivery Room Air/ 21 %   Brief Assessment:   Cardiac Regular    Respiratory clear BS    Gastrointestinal Normal   Routine Hem:  18-Mar-13 05:23    WBC (CBC) 9.9   RBC (CBC) 4.46   Hemoglobin (CBC) 12.6   Hematocrit (CBC) 38.1   Platelet Count (CBC) 293   MCV 85   MCH 28.3   MCHC 33.1   RDW 13.9  Routine Chem:  18-Mar-13 05:23    Glucose, Serum 115   BUN 6   Creatinine (comp) 0.62   Sodium, Serum 136   Potassium, Serum 3.8   Chloride, Serum 102   CO2, Serum 27   Calcium (Total), Serum 8.3  Hepatic:  18-Mar-13 05:23    Bilirubin, Total 0.5   Alkaline Phosphatase 107   SGPT (ALT) 64   SGOT (AST) 42   Total Protein, Serum 7.1   Albumin, Serum 3.0  Routine Chem:  18-Mar-13 05:23    Osmolality (calc) 270   eGFR (African American) >60   eGFR (Non-African American) >60   Anion Gap 7  Routine Hem:  18-Mar-13 05:23    Neutrophil % 77.4   Lymphocyte % 14.8   Monocyte % 7.2   Eosinophil % 0.6   Basophil % 0.0   Neutrophil # 7.7   Lymphocyte # 1.5   Monocyte # 0.7   Eosinophil # 0.1   Basophil # 0.0   Assessment/Plan:  Assessment/Plan:   Assessment S/P biliary sphincterotomy and sludge extraction. Improved but having diarrhea.    Plan Order stool studies. Abx use. If diarrhea improves, ok for discharge from GI point of view. Thanks.   Electronic Signatures: Verdie Shire (MD)  (Signed 21-Mar-13 13:12)  Authored: Chief Complaint,  VITAL SIGNS/ANCILLARY NOTES, Brief Assessment, Lab Results, Assessment/Plan   Last Updated: 21-Mar-13 13:12 by Verdie Shire (MD)

## 2015-08-28 ENCOUNTER — Emergency Department
Admission: EM | Admit: 2015-08-28 | Discharge: 2015-08-28 | Disposition: A | Discharge status: Home / Self Care | Payer: Self-pay | Attending: Emergency Medicine | Admitting: Emergency Medicine

## 2015-08-28 DIAGNOSIS — M791 Myalgia, unspecified site: Secondary | ICD-10-CM

## 2015-08-28 DIAGNOSIS — R51 Headache: Secondary | ICD-10-CM | POA: Insufficient documentation

## 2015-08-28 DIAGNOSIS — M79604 Pain in right leg: Secondary | ICD-10-CM | POA: Insufficient documentation

## 2015-08-28 DIAGNOSIS — E079 Disorder of thyroid, unspecified: Secondary | ICD-10-CM | POA: Insufficient documentation

## 2015-08-28 DIAGNOSIS — E119 Type 2 diabetes mellitus without complications: Secondary | ICD-10-CM | POA: Insufficient documentation

## 2015-08-28 HISTORY — DX: Type 2 diabetes mellitus without complications: E11.9

## 2015-08-28 HISTORY — DX: Disorder of thyroid, unspecified: E07.9

## 2015-08-28 MED ORDER — NAPROXEN 500 MG PO TABS: 500.0000 mg | ORAL_TABLET | Freq: Two times a day (BID) | ORAL | Status: DC

## 2015-08-28 NOTE — ED Notes (Signed)
Medical Interpreter used to give patient her discharge instructions.  Pt verbalized understanding of all instructions.

## 2015-08-28 NOTE — ED Notes (Signed)
Interpreter used, Dominique#[id number]

## 2015-08-28 NOTE — Discharge Instructions (Signed)
Dolor músculoesquelético  (Musculoskeletal Pain)  El dolor musculoesquelético se siente en huesos y músculos. El dolor puede ocurrir en cualquier parte del cuerpo. El profesional que lo asiste podrá tratarlo sin conocer la causa del dolor. Lo tratará aunque las pruebas de laboratorio (sangre y orina), las radiografías y otros estudios sean normales. La causa de estos dolores puede ser un virus.   CAUSAS  Generalmente no existe una causa definida para este trastorno. También el malestar puede deberse a la actividad excesiva. En la actividad excesiva se incluye el hacer ejercicios físicos muy intensos cuando no se está en buena forma. El dolor de huesos también puede deberse a cambios climáticos. Los huesos son sensibles a los cambios en la presión atmosférica.  INSTRUCCIONES PARA EL CUIDADO DOMICILIARIO  · Para proteger su privacidad, no se entregarán los resultados de las pruebas por teléfono. Asegúrese de conseguirlos. Consulte el modo en que podrá obtenerlos si no se lo han informado. Es su responsabilidad contar con los resultados de las pruebas.  · Utilice los medicamentos de venta libre o de prescripción para el dolor, el malestar o la fiebre, según se lo indique el profesional que lo asiste.  Si le han administrado medicamentos, no conduzca, no opere maquinarias ni herramientas eléctricas, y tampoco firme documentos legales durante 24 horas. No beba alcohol. No tome píldoras para dormir ni otros medicamentos que puedan interferir en el tratamiento.  · Podrá seguir con todas las actividades a menos que éstas le ocasionen más dolor. Cuando el dolor disminuya, es importante que gradualmente reanude toda la rutina habitual. Retome las actividades comenzando lentamente. Aumente gradualmente la intensidad y la duración de sus actividades o del ejercicio.  · Durante los períodos de dolor intenso, el reposo en cama puede ser beneficioso. Recuéstese o siéntese en la posición que le sea más cómoda.  · Coloque hielo  sobre la zona afectada.    Ponga hielo en una bolsa.    Colóquese una toalla entre la piel y la bolsa de hielo.    Aplique el hielo durante 10 a 20 minutos 3 ó 4 veces por día.  · Si el dolor empeora, o no desaparece puede ser necesario repetir las pruebas o realizar nuevos exámenes. El profesional que lo asiste podrá requerir investigar más profundamente para encontrar la causa posible.  SOLICITE ATENCIÓN MÉDICA DE INMEDIATO SI:  · Siente que el dolor empeora y no se alivia con los medicamentos.  · Siente dolor en el pecho asociado a falta de aire, sudoración, náuseas o vómitos.  · El dolor se localiza en el abdomen.  · Comienza a sentir nuevos síntomas que parecen ser diferentes o que lo preocupan.  ASEGÚRESE DE QUE:   · Comprende las instrucciones para el alta médica.  · Controlará su enfermedad.  · Solicitará atención médica de inmediato según las indicaciones.     Esta información no tiene como fin reemplazar el consejo del médico. Asegúrese de hacerle al médico cualquier pregunta que tenga.     Document Released: 05/11/2005 Document Revised: 10/24/2011  Elsevier Interactive Patient Education ©2016 Elsevier Inc.

## 2015-08-28 NOTE — ED Notes (Signed)
Pt stating she took pill called Flanax (naproxen) for the pain and received some relief from it.

## 2015-08-28 NOTE — ED Provider Notes (Signed)
[facility] Emergency Department Provider Note ____________________________________________  Time seen: Approximately 10:16 AM  I have reviewed the triage vital signs and the nursing notes.   HISTORY  Chief Complaint Bilateral leg pain  HPI Jane Crawford is a 30 y.o. female who presents with bilateral leg pain. Her pain began 15 days ago, and she states that it is not allowed her to get any sleep the last couple of nights. She has a job as a Financial controller at [employer], and states that she is on her feet standing all day long. Her pain is worse when standing in one place then with walking, and she states that she needs to raise her feet for the pain to go away. She reports that the pain is an 8 out of 10 when it is at its worst. She has taken naproxen one time with some improvement.  Past Medical History  Diagnosis Date  . Diabetes mellitus without complication (HCC)   . Thyroid disease     There are no active problems to display for this patient.   Past Surgical History  Procedure Laterality Date  . Cholecystectomy      Current Outpatient Rx  Name  Route  Sig  Dispense  Refill  . naproxen (NAPROSYN) 500 MG tablet   Oral   Take 1 tablet (500 mg total) by mouth 2 (two) times daily with a meal.   20 tablet   0     Allergies Review of patient's allergies indicates no known allergies.  No family history on file.  Social History Social History  Substance Use Topics  . Smoking status: Never Smoker   . Smokeless tobacco: None  . Alcohol Use: No    Review of Systems Constitutional: No fever/chills ENT: No sore throat. Cardiovascular: Denies chest pain. Respiratory: Denies shortness of breath. No hemoptysis, no sputum production Gastrointestinal: No abdominal pain. . Musculoskeletal: Positive for bilateral leg pain. Skin: Negative for rash. Neurological: Positive for headaches with the leg pain Endocrine:+diabetes, +thyroid  disease Hematological/Lymphatic: 10-point ROS otherwise negative.  ____________________________________________   PHYSICAL EXAM:  VITAL SIGNS: ED Triage Vitals  Enc Vitals Group     BP 08/28/15 0911 169/98 mmHg     Pulse Rate 08/28/15 0911 67     Resp 08/28/15 0911 18     Temp 08/28/15 0911 98.2 F (36.8 C)     Temp Source 08/28/15 0911 Oral     SpO2 08/28/15 0911 95 %     Weight 08/28/15 0911 300 lb (136.079 kg)     Height 08/28/15 0911 5\' 2"  (1.575 m)     Head Cir --      Peak Flow --      Pain Score 08/28/15 0913 8     Pain Loc --      Pain Edu? --      Excl. in GC? --    Constitutional: Alert and oriented. Well appearing and in no acute distress. Head: Atraumatic. Neck: No cervical spine tenderness to palpation. Cardiovascular: Normal rate, regular rhythm. Grossly normal heart sounds.  Good peripheral circulation. Elevated blood pressure Respiratory: Normal respiratory effort.  No retractions. Lungs CTAB. Gastrointestinal: Soft and nontender. No distention.  Musculoskeletal: Positive lower extremity tenderness. No significant edema, pitting, or erythema noted.  No joint effusions. Genu valgum noted. Neurologic:  Normal speech and language. No gross focal neurologic deficits are appreciated. No gait instability. Skin:  Skin is warm, dry and intact. No rash noted. Psychiatric: Mood  and affect are normal. Speech and behavior are normal.  ____________________________________________   LABS (all labs ordered are listed, but only abnormal results are displayed)  Labs Reviewed - No data to display ____________________________________________  EKG   ____________________________________________  RADIOLOGY   ____________________________________________   PROCEDURES  Procedure(s) performed: None  Critical Care performed: No  ____________________________________________   INITIAL IMPRESSION / ASSESSMENT AND PLAN / ED COURSE  Pertinent labs & imaging  results that were available during my care of the patient were reviewed by me and considered in my medical decision making (see chart for details). Myofascial pain  Patient was placed on naproxen 500 mg twice a day for inflammation. She will be given a work note for 2 days and should follow-up with her primary care provider to consider a consult with physical therapy.   ____________________________________________   FINAL CLINICAL IMPRESSION(S) / ED DIAGNOSES  Final diagnosis: myalgia    Joni Reiningonald K Monalisa Bayless, PA-C 08/28/15 1040  Joni Reiningonald K Hazelene Doten, PA-C 08/28/15 1040  Sharyn CreamerMark Quale, MD 08/28/15 [phone]

## 2015-08-28 NOTE — ED Notes (Signed)
Pt c/o BL leg/foot pain for the past 15 days.. States he job requires her to be on her feet all day.

## 2019-04-04 ENCOUNTER — Other Ambulatory Visit: Payer: Self-pay

## 2019-04-04 ENCOUNTER — Emergency Department
Admission: EM | Admit: 2019-04-04 | Discharge: 2019-04-04 | Disposition: A | Discharge status: Home / Self Care | Payer: Self-pay | Attending: Emergency Medicine | Admitting: Emergency Medicine

## 2019-04-04 ENCOUNTER — Encounter: Payer: Self-pay | Admitting: Emergency Medicine

## 2019-04-04 DIAGNOSIS — U071 COVID-19: Secondary | ICD-10-CM

## 2019-04-04 DIAGNOSIS — R05 Cough: Secondary | ICD-10-CM | POA: Insufficient documentation

## 2019-04-04 DIAGNOSIS — E119 Type 2 diabetes mellitus without complications: Secondary | ICD-10-CM | POA: Insufficient documentation

## 2019-04-04 DIAGNOSIS — H5710 Ocular pain, unspecified eye: Secondary | ICD-10-CM | POA: Insufficient documentation

## 2019-04-04 DIAGNOSIS — M2540 Effusion, unspecified joint: Secondary | ICD-10-CM | POA: Insufficient documentation

## 2019-04-04 DIAGNOSIS — Z20828 Contact with and (suspected) exposure to other viral communicable diseases: Secondary | ICD-10-CM | POA: Insufficient documentation

## 2019-04-04 DIAGNOSIS — J029 Acute pharyngitis, unspecified: Secondary | ICD-10-CM | POA: Insufficient documentation

## 2019-04-04 DIAGNOSIS — R51 Headache: Secondary | ICD-10-CM | POA: Insufficient documentation

## 2019-04-04 DIAGNOSIS — M791 Myalgia, unspecified site: Secondary | ICD-10-CM | POA: Insufficient documentation

## 2019-04-04 DIAGNOSIS — R509 Fever, unspecified: Secondary | ICD-10-CM | POA: Insufficient documentation

## 2019-04-04 LAB — SARS CORONAVIRUS 2 (TAT 6-24 HRS): SARS Coronavirus 2: NEGATIVE

## 2019-04-04 NOTE — ED Provider Notes (Signed)
[facility] Emergency Department Provider Note   ____________________________________________    I have reviewed the triage vital signs and the nursing notes.   HISTORY  Chief Complaint Fever, Generalized Body Aches, and Joint Swelling   Spanish interpreter used  HPI Jane Crawford is a 33 y.o. female who presents with complaints of fever, myalgias, joint soreness.  She lives in a household with mother and father who are experiencing similar symptoms.  Her parents work in an environment with multiple positive COVID patients.  She denies shortness of breath.  Positive cough  Past Medical History:  Diagnosis Date  . Diabetes mellitus without complication ([place])   . Thyroid disease     There are no active problems to display for this patient.   Past Surgical History:  Procedure Laterality Date  . CHOLECYSTECTOMY      Prior to Admission medications   Medication Sig Start Date End Date Taking? Authorizing Provider  naproxen (NAPROSYN) 500 MG tablet Take 1 tablet (500 mg total) by mouth 2 (two) times daily with a meal. 08/28/15   Sable Feil, PA-C     Allergies Patient has no known allergies.  No family history on file.  Social History Social History   Tobacco Use  . Smoking status: Never Smoker  . Smokeless tobacco: Never Used  Substance Use Topics  . Alcohol use: No  . Drug use: Not on file    Review of Systems  Constitutional: No fever/chills  ENT: No sore throat.   Gastrointestinal: No abdominal pain.  No nausea, no vomiting.   Genitourinary: Negative for dysuria. Musculoskeletal: Myalgias Skin: Negative for rash. Neurological: Intermittent headaches    ____________________________________________   PHYSICAL EXAM:  VITAL SIGNS: ED Triage Vitals  Enc Vitals Group     BP 04/04/19 1007 119/74     Pulse Rate 04/04/19 1007 60     Resp 04/04/19 1007 18     Temp 04/04/19 1007 98.6 F (37 C)     Temp  Source 04/04/19 1007 Oral     SpO2 04/04/19 1007 97 %     Weight 04/04/19 1007 122.5 kg (270 lb)     Height 04/04/19 1007 1.549 m (5\' 1" )     Head Circumference --      Peak Flow --      Pain Score 04/04/19 1008 8     Pain Loc --      Pain Edu? --      Excl. in [place]? --      Constitutional: Alert and oriented.  Mouth/Throat: Mucous membranes are moist.   Cardiovascular: Normal rate, regular rhythm.  Respiratory: Normal respiratory effort.  No retractions. Genitourinary: deferred Musculoskeletal: No lower extremity tenderness nor edema.   Neurologic:  Normal speech and language. No gross focal neurologic deficits are appreciated.   Skin:  Skin is warm, dry and intact. No rash noted.   ____________________________________________   LABS (all labs ordered are listed, but only abnormal results are displayed)  Labs Reviewed  SARS CORONAVIRUS 2   ____________________________________________  EKG   ____________________________________________  RADIOLOGY  None ____________________________________________   PROCEDURES  Procedure(s) performed: No  Procedures   Critical Care performed: No ____________________________________________   INITIAL IMPRESSION / ASSESSMENT AND PLAN / ED COURSE  Pertinent labs & imaging results that were available during my care of the patient were reviewed by me and considered in my medical decision making (see chart for details).  Patient presents with fever cough myalgias.  No shortness of breath.  Vital signs reassuring.  Extremely high risk of coronavirus, will send swab, urged strict quarantine   ____________________________________________   FINAL CLINICAL IMPRESSION(S) / ED DIAGNOSES  Final diagnoses:  COVID-19      NEW MEDICATIONS STARTED DURING THIS VISIT:  Discharge Medication List as of 04/04/2019 10:46 AM       Note:  This document was prepared using Dragon voice recognition software and may include  unintentional dictation errors.   Jene EveryKinner, Adonia Porada, MD 04/04/19 [id number]

## 2019-04-04 NOTE — ED Triage Notes (Signed)
Pt here with c/o being around positive covid, sore throat, fever, joint swelling and pain in left wrist, left eye pain all starting yesterday NAD.

## 2019-04-04 NOTE — ED Notes (Signed)
Patient educated on CDC guidelines of quarantining from others due to Covid symptoms.  Instructed on notifying employer of symptoms and notifying any friends or family recently exposed.  Given instructions to come back to ED if shortness of breath increases.   

## 2019-04-04 NOTE — ED Notes (Signed)
Through Spanish interpreter patient reports fever, eye pain, and sore throat.

## 2019-04-26 ENCOUNTER — Emergency Department: Payer: No Typology Code available for payment source

## 2019-04-26 ENCOUNTER — Encounter: Payer: Self-pay | Admitting: Emergency Medicine

## 2019-04-26 ENCOUNTER — Emergency Department
Admission: EM | Admit: 2019-04-26 | Discharge: 2019-04-26 | Disposition: A | Discharge status: Home / Self Care | Payer: No Typology Code available for payment source | Attending: Student | Admitting: Student

## 2019-04-26 ENCOUNTER — Other Ambulatory Visit: Payer: Self-pay

## 2019-04-26 DIAGNOSIS — R109 Unspecified abdominal pain: Secondary | ICD-10-CM | POA: Diagnosis not present

## 2019-04-26 DIAGNOSIS — M542 Cervicalgia: Secondary | ICD-10-CM | POA: Insufficient documentation

## 2019-04-26 DIAGNOSIS — M79601 Pain in right arm: Secondary | ICD-10-CM | POA: Diagnosis not present

## 2019-04-26 DIAGNOSIS — Z7984 Long term (current) use of oral hypoglycemic drugs: Secondary | ICD-10-CM | POA: Insufficient documentation

## 2019-04-26 DIAGNOSIS — R079 Chest pain, unspecified: Secondary | ICD-10-CM | POA: Insufficient documentation

## 2019-04-26 DIAGNOSIS — T1490XA Injury, unspecified, initial encounter: Secondary | ICD-10-CM

## 2019-04-26 DIAGNOSIS — Z79899 Other long term (current) drug therapy: Secondary | ICD-10-CM | POA: Insufficient documentation

## 2019-04-26 DIAGNOSIS — M7918 Myalgia, other site: Secondary | ICD-10-CM

## 2019-04-26 DIAGNOSIS — Y998 Other external cause status: Secondary | ICD-10-CM | POA: Insufficient documentation

## 2019-04-26 DIAGNOSIS — Y92410 Unspecified street and highway as the place of occurrence of the external cause: Secondary | ICD-10-CM | POA: Insufficient documentation

## 2019-04-26 DIAGNOSIS — Y9389 Activity, other specified: Secondary | ICD-10-CM | POA: Diagnosis not present

## 2019-04-26 DIAGNOSIS — E119 Type 2 diabetes mellitus without complications: Secondary | ICD-10-CM | POA: Diagnosis not present

## 2019-04-26 DIAGNOSIS — R51 Headache: Secondary | ICD-10-CM | POA: Diagnosis present

## 2019-04-26 LAB — POCT PREGNANCY, URINE: Preg Test, Ur: NEGATIVE

## 2019-04-26 LAB — URINALYSIS, COMPLETE (UACMP) WITH MICROSCOPIC
Bilirubin Urine: NEGATIVE
Glucose, UA: NEGATIVE mg/dL
Hgb urine dipstick: NEGATIVE
Ketones, ur: NEGATIVE mg/dL
Nitrite: NEGATIVE
Protein, ur: NEGATIVE mg/dL
Specific Gravity, Urine: 1.018 (ref 1.005–1.030)
pH: 5 (ref 5.0–8.0)

## 2019-04-26 LAB — COMPREHENSIVE METABOLIC PANEL
ALT: 26 U/L (ref 0–44)
AST: 26 U/L (ref 15–41)
Albumin: 3.9 g/dL (ref 3.5–5.0)
Alkaline Phosphatase: 83 U/L (ref 38–126)
Anion gap: 9 (ref 5–15)
BUN: 12 mg/dL (ref 6–20)
CO2: 24 mmol/L (ref 22–32)
Calcium: 8.7 mg/dL — ABNORMAL LOW (ref 8.9–10.3)
Chloride: 106 mmol/L (ref 98–111)
Creatinine, Ser: 0.54 mg/dL (ref 0.44–1.00)
GFR calc Af Amer: >60 mL/min (ref >60)
GFR calc non Af Amer: >60 mL/min (ref >60)
Glucose, Bld: 132 mg/dL — ABNORMAL HIGH (ref 70–99)
Potassium: 3.3 mmol/L — ABNORMAL LOW (ref 3.5–5.1)
Sodium: 139 mmol/L (ref 135–145)
Total Bilirubin: 0.5 mg/dL (ref 0.3–1.2)
Total Protein: 7.6 g/dL (ref 6.5–8.1)

## 2019-04-26 LAB — CBC WITH DIFFERENTIAL/PLATELET
Abs Immature Granulocytes: 0.03 10*3/uL (ref 0.00–0.07)
Basophils Absolute: 0 10*3/uL (ref 0.0–0.1)
Basophils Relative: 0 %
Eosinophils Absolute: 0.1 10*3/uL (ref 0.0–0.5)
Eosinophils Relative: 1 %
HCT: 40.5 % (ref 36.0–46.0)
Hemoglobin: 13.5 g/dL (ref 12.0–15.0)
Immature Granulocytes: 0 %
Lymphocytes Relative: 31 %
Lymphs Abs: 2.8 10*3/uL (ref 0.7–4.0)
MCH: 27.8 pg (ref 26.0–34.0)
MCHC: 33.3 g/dL (ref 30.0–36.0)
MCV: 83.5 fL (ref 80.0–100.0)
Monocytes Absolute: 0.6 10*3/uL (ref 0.1–1.0)
Monocytes Relative: 7 %
Neutro Abs: 5.5 10*3/uL (ref 1.7–7.7)
Neutrophils Relative %: 61 %
Platelets: 391 10*3/uL (ref 150–400)
RBC: 4.85 MIL/uL (ref 3.87–5.11)
RDW: 13.2 % (ref 11.5–15.5)
WBC: 9.2 10*3/uL (ref 4.0–10.5)
nRBC: 0 % (ref 0.0–0.2)

## 2019-04-26 MED ORDER — CYCLOBENZAPRINE HCL 10 MG PO TABS: 10.0000 mg | ORAL_TABLET | Freq: Once | ORAL | Status: DC

## 2019-04-26 MED ORDER — HYDROCODONE-ACETAMINOPHEN 5-325 MG PO TABS
1.0000 | ORAL_TABLET | Freq: Once | ORAL | Status: AC
  Administered 2019-04-26: 21:00:00 1 via ORAL
  Filled 2019-04-26: qty 1

## 2019-04-26 MED ORDER — IOHEXOL 300 MG/ML  SOLN
100.0000 mL | Freq: Once | INTRAMUSCULAR | Status: AC | PRN
  Administered 2019-04-26: 100 mL via INTRAVENOUS
  Filled 2019-04-26: qty 100

## 2019-04-26 MED ORDER — CYCLOBENZAPRINE HCL 5 MG PO TABS: 5.0000 mg | ORAL_TABLET | Freq: Three times a day (TID) | ORAL | 0 refills | Status: DC | PRN

## 2019-04-26 MED ORDER — NAPROXEN 500 MG PO TABS: 500.0000 mg | ORAL_TABLET | Freq: Once | ORAL | Status: DC

## 2019-04-26 MED ORDER — NAPROXEN 500 MG PO TABS: 500.0000 mg | ORAL_TABLET | Freq: Two times a day (BID) | ORAL | 0 refills | Status: AC
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
## 2019-07-15 ENCOUNTER — Emergency Department
Admission: EM | Admit: 2019-07-15 | Discharge: 2019-07-15 | Disposition: A | Discharge status: Home / Self Care | Payer: Self-pay | Attending: Student | Admitting: Student

## 2019-07-15 ENCOUNTER — Emergency Department: Payer: Self-pay

## 2019-07-15 ENCOUNTER — Encounter: Payer: Self-pay | Admitting: Emergency Medicine

## 2019-07-15 ENCOUNTER — Other Ambulatory Visit: Payer: Self-pay

## 2019-07-15 DIAGNOSIS — Z79899 Other long term (current) drug therapy: Secondary | ICD-10-CM | POA: Insufficient documentation

## 2019-07-15 DIAGNOSIS — E119 Type 2 diabetes mellitus without complications: Secondary | ICD-10-CM | POA: Insufficient documentation

## 2019-07-15 DIAGNOSIS — R300 Dysuria: Secondary | ICD-10-CM | POA: Insufficient documentation

## 2019-07-15 DIAGNOSIS — R002 Palpitations: Secondary | ICD-10-CM | POA: Insufficient documentation

## 2019-07-15 DIAGNOSIS — R0789 Other chest pain: Secondary | ICD-10-CM | POA: Insufficient documentation

## 2019-07-15 DIAGNOSIS — Z7984 Long term (current) use of oral hypoglycemic drugs: Secondary | ICD-10-CM | POA: Insufficient documentation

## 2019-07-15 DIAGNOSIS — Z20828 Contact with and (suspected) exposure to other viral communicable diseases: Secondary | ICD-10-CM | POA: Insufficient documentation

## 2019-07-15 DIAGNOSIS — R079 Chest pain, unspecified: Secondary | ICD-10-CM

## 2019-07-15 LAB — CBC WITH DIFFERENTIAL/PLATELET
Abs Immature Granulocytes: 0.02 10*3/uL (ref 0.00–0.07)
Basophils Absolute: 0.1 10*3/uL (ref 0.0–0.1)
Basophils Relative: 1 %
Eosinophils Absolute: 0.2 10*3/uL (ref 0.0–0.5)
Eosinophils Relative: 2 %
HCT: 42.4 % (ref 36.0–46.0)
Hemoglobin: 14 g/dL (ref 12.0–15.0)
Immature Granulocytes: 0 %
Lymphocytes Relative: 45 %
Lymphs Abs: 4.2 10*3/uL — ABNORMAL HIGH (ref 0.7–4.0)
MCH: 27.4 pg (ref 26.0–34.0)
MCHC: 33 g/dL (ref 30.0–36.0)
MCV: 83 fL (ref 80.0–100.0)
Monocytes Absolute: 0.5 10*3/uL (ref 0.1–1.0)
Monocytes Relative: 6 %
Neutro Abs: 4.3 10*3/uL (ref 1.7–7.7)
Neutrophils Relative %: 46 %
Platelets: 371 10*3/uL (ref 150–400)
RBC: 5.11 MIL/uL (ref 3.87–5.11)
RDW: 13.7 % (ref 11.5–15.5)
WBC: 9.3 10*3/uL (ref 4.0–10.5)
nRBC: 0 % (ref 0.0–0.2)

## 2019-07-15 LAB — URINALYSIS, COMPLETE (UACMP) WITH MICROSCOPIC
Bilirubin Urine: NEGATIVE
Glucose, UA: NEGATIVE mg/dL
Hgb urine dipstick: NEGATIVE
Ketones, ur: NEGATIVE mg/dL
Nitrite: NEGATIVE
Protein, ur: NEGATIVE mg/dL
Specific Gravity, Urine: 1.005 (ref 1.005–1.030)
pH: 5 (ref 5.0–8.0)

## 2019-07-15 LAB — PREGNANCY, URINE: Preg Test, Ur: NEGATIVE

## 2019-07-15 LAB — LIPASE, BLOOD: Lipase: 32 U/L (ref 11–51)

## 2019-07-15 LAB — COMPREHENSIVE METABOLIC PANEL
ALT: 28 U/L (ref 0–44)
AST: 25 U/L (ref 15–41)
Albumin: 4 g/dL (ref 3.5–5.0)
Alkaline Phosphatase: 97 U/L (ref 38–126)
Anion gap: 11 (ref 5–15)
BUN: 9 mg/dL (ref 6–20)
CO2: 22 mmol/L (ref 22–32)
Calcium: 9 mg/dL (ref 8.9–10.3)
Chloride: 106 mmol/L (ref 98–111)
Creatinine, Ser: 0.53 mg/dL (ref 0.44–1.00)
GFR calc Af Amer: >60 mL/min (ref >60)
GFR calc non Af Amer: >60 mL/min (ref >60)
Glucose, Bld: 113 mg/dL — ABNORMAL HIGH (ref 70–99)
Potassium: 3.7 mmol/L (ref 3.5–5.1)
Sodium: 139 mmol/L (ref 135–145)
Total Bilirubin: 0.5 mg/dL (ref 0.3–1.2)
Total Protein: 7.9 g/dL (ref 6.5–8.1)

## 2019-07-15 LAB — TROPONIN I (HIGH SENSITIVITY): Troponin I (High Sensitivity): 4 ng/L (ref <18)

## 2019-07-15 MED ORDER — MECLIZINE HCL 25 MG PO TABS
25.0000 mg | ORAL_TABLET | Freq: Once | ORAL | Status: AC
  Administered 2019-07-15: 21:00:00 25 mg via ORAL
  Filled 2019-07-15: qty 1

## 2019-07-15 MED ORDER — KETOROLAC TROMETHAMINE 30 MG/ML IJ SOLN
30.0000 mg | Freq: Once | INTRAMUSCULAR | Status: AC
  Administered 2019-07-15: 30 mg via INTRAMUSCULAR
  Filled 2019-07-15: qty 1

## 2019-07-15 NOTE — ED Provider Notes (Signed)
[facility] Emergency Department Provider Note  ____________________________________________   First MD Initiated Contact with Patient 07/15/19 1851     (approximate)  I have reviewed the triage vital signs and the nursing notes.  History  Chief Complaint Chest Pain and Palpitations    HPI Jane Crawford is a 33 y.o. female with a history of diabetes, thyroid disease who presents for multiple complaints. Symptoms all started yesterday. She reports chest discomfort which she describes as a pounding sensation.  Located in the center of her chest.  Mild in severity.  No radiation, no alleviating/aggravating factors.  She reports dysuria for the same time period.  She has some generalized, vague abdominal discomfort, perhaps worse in the LLQ, but truly "all over" in location.  Minimal in severity.  No associated vaginal discharge or bleeding.  No history of kidney stones. Denies any fevers, nausea, vomiting, diarrhea, flank pain.   She also reports some shortness of breath when laying flat on her back, feeling like her mouth/throat is blocked.  History obtained with the assistance of a translator.   Past Medical Hx Past Medical History:  Diagnosis Date  . Diabetes mellitus without complication ([place])   . Thyroid disease     Problem List There are no active problems to display for this patient.   Past Surgical Hx Past Surgical History:  Procedure Laterality Date  . CHOLECYSTECTOMY      Medications Prior to Admission medications   Medication Sig Start Date End Date Taking? Authorizing Provider  cyclobenzaprine (FLEXERIL) 5 MG tablet Take 1 tablet (5 mg total) by mouth 3 (three) times daily as needed. 04/26/19   Menshew, Dannielle Karvonen, PA-C  levothyroxine (SYNTHROID) 50 MCG tablet Take 50 mcg by mouth daily before breakfast.    [provider]  loratadine (CLARITIN) 10 MG tablet Take 10 mg by mouth daily.    [provider]   metFORMIN (GLUCOPHAGE) 500 MG tablet Take 500 mg by mouth 2 (two) times daily with a meal.    [provider]    Allergies Patient has no known allergies.  Family Hx No family history on file.  Social Hx Social History   Tobacco Use  . Smoking status: Never Smoker  . Smokeless tobacco: Never Used  Substance Use Topics  . Alcohol use: No  . Drug use: Not on file     Review of Systems  Constitutional: Negative for fever, chills. Eyes: Negative for visual changes. ENT: Negative for sore throat. Cardiovascular: + for chest pain. Respiratory: Negative for shortness of breath. Gastrointestinal: Negative for nausea, vomiting. + abdominal pain Genitourinary: + for dysuria. Musculoskeletal: Negative for leg swelling. Skin: Negative for rash. Neurological: Negative for for headaches.   Physical Exam  Vital Signs: ED Triage Vitals  Enc Vitals Group     BP 07/15/19 1744 108/69     Pulse Rate 07/15/19 1744 77     Resp 07/15/19 1744 16     Temp 07/15/19 1744 98.3 F (36.8 C)     Temp Source 07/15/19 1744 Oral     SpO2 07/15/19 1744 97 %     Weight 07/15/19 1751 258 lb (117 kg)     Height 07/15/19 1751 5\' 1"  (1.549 m)     Head Circumference --      Peak Flow --      Pain Score 07/15/19 1751 8     Pain Loc --      Pain Edu? --  Excl. in [place]? --     Constitutional: Alert and oriented.  Head: Normocephalic. Atraumatic. Eyes: Conjunctivae clear. Sclera anicteric. Nose: No congestion. No rhinorrhea. Mouth/Throat: Wearing mask.  Neck: No stridor.   Cardiovascular: Normal rate, regular rhythm. Extremities well perfused. Respiratory: Normal respiratory effort.  Lungs CTAB. Gastrointestinal: Soft. Non-tender throughout to deep palpation. Non-distended.  Musculoskeletal: No lower extremity edema. No deformities. Neurologic:  Normal speech and language. No gross focal neurologic deficits are appreciated. Moves all extremities equally. No nystagmus.  Skin: Skin  is warm, dry and intact. No rash noted. Psychiatric: Mood and affect are appropriate for situation.  EKG  Personally reviewed. EKG obtained at 17:49.  Rate: 88 Rhythm: sinus Axis: normal Intervals: WNL TWI in III No acute ischemic changes No evidence of Brugada, WPW, or prolonged QTc No STEMI  Repeat at 20:17.  Rate: 62 Rhythm: sinus Axis: normal Intervals: within normal limits T wave inversions no longer present No evidence of Brugada, WPW, or prolonged QTC No acute ischemic changes No STEMI    Radiology  XR: IMPRESSION: No active disease.     Procedures  Procedure(s) performed (including critical care):  Procedures   Initial Impression / Assessment and Plan / ED Course  33 y.o. female who presents to the ED for multiple complaints, as above.  Primarily pounding chest discomfort, dysuria x 2 days.  Ddx: ACS, anemia, electrolyte abnormality, UTI, viral process, dehydration, pregnancy. Low suspicion for central etiology of her dizziness given her age, lack of risk factors, no associated neurological symptoms.  Suspect her shortness of breath while laying flat is related to OSA given her body habitus.  No other evidence of heart failure or risk factors of this.  We will plan for labs, urine, EKG, XR. Symptomatic treatment.   Work-up largely unremarkable.  EKG without acute ischemic changes or evidence of arrhythmia.  Troponin negative, given length of symptoms ~1-2 days do not feel repeat necessary.  Electrolytes within normal limits.  No anemia.  UA equivocal, sent for culture.   Discussed that COVID swab will result in 18-24 hours, she voices understanding of this.   Discussed results, plan of care, and discharge precautions w/ patient with interpreter. Patient voices understanding and is comfortable with plan.    Final Clinical Impression(s) / ED Diagnosis  Final diagnoses:  Chest pain in adult  Dysuria       Note:  This document was prepared  using Dragon voice recognition software and may include unintentional dictation errors.   Miguel Aschoff., MD 07/15/19 2044

## 2019-07-15 NOTE — ED Notes (Signed)
Interpretor Kennyth Lose in for triage.

## 2019-07-15 NOTE — ED Triage Notes (Signed)
Pt was to be seen at the health dept today because she hasn't had a period in 7 months, when leaving, pt felt faint, began having cp and palpitations, came here to be seen, did not pass out. Hr WNL, NAD.

## 2019-07-15 NOTE — ED Notes (Signed)
PT reporting dizziness with chest pain and abd pain since yesterday. Pt reports the dizziness is as though the room is spinning.   Chest pain located in the center of chest, pounding in nature. Abd pain is a generalized but worse in the LLQ. No NVD. Dysuria reported since yesterday.   SOB since yesterday with difficulty laying flat on her back.

## 2019-07-15 NOTE — Discharge Instructions (Addendum)
Thank you for letting us take care of you in the emergency department today.   Please continue to take any regular, prescribed medications.   At this time, your coronavirus swab results are pending. You should hear your results in 1-2 days if they are positive.   In the meantime, it is important to take precautions in case you are positive. This includes quarantining, wearing a mask, and social distancing.   Continue to take over the counter acetaminophen and ibuprofen as directed on the box to help with fevers as well as aches and pains.   Please return to the ER for any new or worsening symptoms, such as difficulty breathing, vomiting and diarrhea, or chest pain.    Gracias por dejarnos atenderlo en el departamento de emergencias hoy.  Contine tomando cualquier medicamento recetado con regularidad.  En [employer], los [place] de su hisopo de coronavirus estn pendientes. Debera escuchar sus resultados en 1-2 das si son positivos.  Mientras tanto, es importante tomar precauciones en caso de que sea positivo. [place], el uso de [place] y Astronomer social.  Contine tomando acetaminofn e ibuprofeno de venta libre como se indica en la caja para ayudar con la [place], as como con los dolores y [place].  Regrese a la sala de emergencias si tiene sntomas nuevos o que empeoran, como dificultad para Ambulance person, vmitos y 5 o Tourist information centre manager.

## 2019-07-16 LAB — URINE CULTURE

## 2019-07-17 LAB — NOVEL CORONAVIRUS, NAA (HOSP ORDER, SEND-OUT TO REF LAB; TAT 18-24 HRS): SARS-CoV-2, NAA: NOT DETECTED

## 2020-11-29 ENCOUNTER — Other Ambulatory Visit: Payer: Self-pay

## 2020-11-29 ENCOUNTER — Emergency Department: Payer: No Typology Code available for payment source

## 2020-11-29 ENCOUNTER — Emergency Department
Admission: EM | Admit: 2020-11-29 | Discharge: 2020-11-29 | Disposition: A | Discharge status: Home / Self Care | Payer: No Typology Code available for payment source | Attending: Emergency Medicine | Admitting: Emergency Medicine

## 2020-11-29 ENCOUNTER — Encounter: Payer: Self-pay | Admitting: Radiology

## 2020-11-29 DIAGNOSIS — E119 Type 2 diabetes mellitus without complications: Secondary | ICD-10-CM | POA: Diagnosis not present

## 2020-11-29 DIAGNOSIS — M25551 Pain in right hip: Secondary | ICD-10-CM | POA: Diagnosis not present

## 2020-11-29 DIAGNOSIS — M542 Cervicalgia: Secondary | ICD-10-CM

## 2020-11-29 DIAGNOSIS — M25511 Pain in right shoulder: Secondary | ICD-10-CM | POA: Insufficient documentation

## 2020-11-29 DIAGNOSIS — M546 Pain in thoracic spine: Secondary | ICD-10-CM | POA: Insufficient documentation

## 2020-11-29 DIAGNOSIS — R930 Abnormal findings on diagnostic imaging of skull and head, not elsewhere classified: Secondary | ICD-10-CM | POA: Insufficient documentation

## 2020-11-29 DIAGNOSIS — Z7984 Long term (current) use of oral hypoglycemic drugs: Secondary | ICD-10-CM | POA: Diagnosis not present

## 2020-11-29 LAB — POC URINE PREG, ED: Preg Test, Ur: NEGATIVE

## 2020-11-29 MED ORDER — IBUPROFEN 600 MG PO TABS: 600.0000 mg | ORAL_TABLET | Freq: Three times a day (TID) | ORAL | 0 refills | Status: AC | PRN

## 2020-11-29 MED ORDER — CYCLOBENZAPRINE HCL 10 MG PO TABS
5.0000 mg | ORAL_TABLET | Freq: Once | ORAL | Status: AC
  Administered 2020-11-29: 5 mg via ORAL
  Filled 2020-11-29: qty 1

## 2020-11-29 MED ORDER — IBUPROFEN 800 MG PO TABS
800.0000 mg | ORAL_TABLET | Freq: Once | ORAL | Status: AC
  Administered 2020-11-29: 800 mg via ORAL
  Filled 2020-11-29: qty 1

## 2020-11-29 MED ORDER — CYCLOBENZAPRINE HCL 5 MG PO TABS: 5.0000 mg | ORAL_TABLET | Freq: Three times a day (TID) | ORAL | 0 refills | Status: AC | PRN
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
## 2022-03-24 ENCOUNTER — Other Ambulatory Visit: Payer: Self-pay | Admitting: Family Medicine

## 2022-03-24 DIAGNOSIS — O24419 Gestational diabetes mellitus in pregnancy, unspecified control: Secondary | ICD-10-CM

## 2022-04-14 ENCOUNTER — Ambulatory Visit: Admission: RE | Admit: 2022-04-14 | Payer: Self-pay | Source: Ambulatory Visit
